# Patient Record
Sex: Female | Born: 1960 | Race: White | Hispanic: No | Marital: Married | Smoking: Never smoker
Health system: Southern US, Community
[De-identification: ages and names within clinical notes are randomized; demographics above are authoritative.]

## PROBLEM LIST (undated history)

## (undated) DIAGNOSIS — Z9889 Other specified postprocedural states: Secondary | ICD-10-CM

## (undated) DIAGNOSIS — K219 Gastro-esophageal reflux disease without esophagitis: Secondary | ICD-10-CM

## (undated) DIAGNOSIS — R112 Nausea with vomiting, unspecified: Secondary | ICD-10-CM

## (undated) HISTORY — PX: CHOLECYSTECTOMY: SHX55

## (undated) HISTORY — PX: HERNIA REPAIR: SHX51

---

## 2012-05-10 DIAGNOSIS — R7303 Prediabetes: Secondary | ICD-10-CM | POA: Insufficient documentation

## 2013-09-08 ENCOUNTER — Ambulatory Visit (INDEPENDENT_AMBULATORY_CARE_PROVIDER_SITE_OTHER): Payer: BC Managed Care – PPO | Admitting: Podiatry

## 2013-09-08 ENCOUNTER — Ambulatory Visit (INDEPENDENT_AMBULATORY_CARE_PROVIDER_SITE_OTHER): Payer: BC Managed Care – PPO

## 2013-09-08 ENCOUNTER — Encounter: Payer: Self-pay | Admitting: Podiatry

## 2013-09-08 VITALS — BP 127/72 | HR 70 | Resp 16 | Ht 63.0 in | Wt 213.0 lb

## 2013-09-08 DIAGNOSIS — M722 Plantar fascial fibromatosis: Secondary | ICD-10-CM

## 2013-09-08 MED ORDER — DICLOFENAC SODIUM 75 MG PO TBEC: 75.0000 mg | DELAYED_RELEASE_TABLET | Freq: Two times a day (BID) | ORAL | Status: DC

## 2013-09-08 MED ORDER — TRIAMCINOLONE ACETONIDE 10 MG/ML IJ SUSP
10.0000 mg | Freq: Once | INTRAMUSCULAR | Status: AC
  Administered 2013-09-08: 10 mg

## 2013-09-08 NOTE — Progress Notes (Signed)
   Subjective:    Patient ID: Rachel Kidd, female    DOB: 1961-03-31, 53 y.o.   MRN: [id number]  HPI Comments: i have heel pain in my right heel. Its been going on for 3 weeks. Its gotten worse. It hurts to walk. i wear orthotics, i wear compression hose, i used a frozen water ball, ace wrap and rest.  Foot Pain      Review of Systems  All other systems reviewed and are negative.      Objective:   Physical Exam        Assessment & Plan:

## 2013-09-08 NOTE — Progress Notes (Signed)
Subjective:     Patient ID: Rachel Kidd, female   DOB: 1960-12-19, 53 y.o.   MRN: [id number]  Foot Pain   patient presents stating my right heel has been hurting me for the last 3 weeks after I was on a ladder. I tried frozen water ball and I wear my orthotics   Review of Systems  All other systems reviewed and are negative.      Objective:   Physical Exam  Nursing note and vitals reviewed. Constitutional: She is oriented to person, place, and time.  Cardiovascular: Intact distal pulses.   Musculoskeletal: Normal range of motion.  Neurological: She is oriented to person, place, and time.  Skin: Skin is warm.   neurovascular status is intact with patient having 8/10 discomfort plantar aspect right heel at the insertion of the tendon into the calcaneus. She is noted to have normal muscle strength and range of motion of the subtalar midtarsal joint and digits are well-perfused with a depressed arch on weightbearing evaluation     Assessment:     Acute plantar fasciitis of the right heel    Plan:     H&P and x-rays reviewed with patient and at this point I have recommended injection treatment 3 mg Kenalog 5 mg Xylocaine Marcaine mixture which was administered and fascially brace. We may consider new orthotics and we'll discuss it next visit and also placed on Voltaren 75 mg twice a day

## 2013-09-22 ENCOUNTER — Ambulatory Visit (INDEPENDENT_AMBULATORY_CARE_PROVIDER_SITE_OTHER): Payer: BC Managed Care – PPO | Admitting: Podiatry

## 2013-09-22 ENCOUNTER — Ambulatory Visit: Payer: Self-pay | Admitting: Podiatry

## 2013-09-22 VITALS — BP 129/77 | HR 79 | Resp 16

## 2013-09-22 DIAGNOSIS — M722 Plantar fascial fibromatosis: Secondary | ICD-10-CM

## 2013-09-22 NOTE — Progress Notes (Signed)
Subjective:     Patient ID: Rachel Kidd, female   DOB: 1960-06-07, 53 y.o.   MRN: [id number]  HPI patient presents stating my heel is doing much better still tender when pressed but I believe it has improved. Patient states she does so well and orthotics and no she needs a new pair   Review of Systems     Objective:   Physical Exam Neurovascular status intact with significant discomfort still noted upon deep palpation but significant improvement from previous visit with plantar fasciitis right    Assessment:     Plantar fasciitis right that showing improvement    Plan:     Instructed on physical therapy and scanned for custom orthotics to reduce stress against the heel and arch reappoint when ready

## 2013-11-21 ENCOUNTER — Ambulatory Visit (INDEPENDENT_AMBULATORY_CARE_PROVIDER_SITE_OTHER): Payer: BC Managed Care – PPO | Admitting: *Deleted

## 2013-11-21 VITALS — BP 127/72 | HR 70 | Resp 16

## 2013-11-21 DIAGNOSIS — M722 Plantar fascial fibromatosis: Secondary | ICD-10-CM

## 2013-11-21 NOTE — Progress Notes (Signed)
Pt presents to pick up orthotics and went over wearing instructions. 

## 2013-11-21 NOTE — Patient Instructions (Signed)

## 2016-05-05 DIAGNOSIS — G8929 Other chronic pain: Secondary | ICD-10-CM | POA: Insufficient documentation

## 2016-05-05 DIAGNOSIS — M25562 Pain in left knee: Secondary | ICD-10-CM | POA: Insufficient documentation

## 2016-05-06 DIAGNOSIS — M170 Bilateral primary osteoarthritis of knee: Secondary | ICD-10-CM | POA: Insufficient documentation

## 2018-03-02 DIAGNOSIS — E669 Obesity, unspecified: Secondary | ICD-10-CM | POA: Insufficient documentation

## 2019-01-31 ENCOUNTER — Ambulatory Visit: Payer: Self-pay | Admitting: Podiatry

## 2019-02-07 ENCOUNTER — Ambulatory Visit (INDEPENDENT_AMBULATORY_CARE_PROVIDER_SITE_OTHER): Payer: BC Managed Care – PPO | Admitting: Podiatry

## 2019-02-07 ENCOUNTER — Other Ambulatory Visit: Payer: Self-pay

## 2019-02-07 DIAGNOSIS — M722 Plantar fascial fibromatosis: Secondary | ICD-10-CM

## 2019-02-10 NOTE — Progress Notes (Signed)
   HPI: 58 y.o. female presenting today for follow up evaluation of plantar fasciitis of the bilateral feet. She states she is doing well and denies any pain. She has not complaints or concerns and denies modifying factors. Patient is here for further evaluation and treatment.   No past medical history on file.   Physical Exam: General: The patient is alert and oriented x3 in no acute distress.  Dermatology: Skin is warm, dry and supple bilateral lower extremities. Negative for open lesions or macerations.  Vascular: Palpable pedal pulses bilaterally. No edema or erythema noted. Capillary refill within normal limits.  Neurological: Epicritic and protective threshold grossly intact bilaterally.   Musculoskeletal Exam: Range of motion within normal limits to all pedal and ankle joints bilateral. Muscle strength 5/5 in all groups bilateral.   Assessment: 1. Plantar fasciitis bilateral - resolved    Plan of Care:  1. Patient evaluated.   2. Appointment with Liliane Channel, Pedorthist, for custom molded orthotics.  3. Recommended good shoe gear.  4. Return to clinic as needed.   Works at [employer] in [place].       Edrick Kins, DPM [facility]  Dr. Edrick Kins, DPM    [address], [place] [address]                Office [phone]  Fax [phone]

## 2019-02-22 ENCOUNTER — Ambulatory Visit (INDEPENDENT_AMBULATORY_CARE_PROVIDER_SITE_OTHER): Payer: BC Managed Care – PPO | Admitting: Orthotics

## 2019-02-22 ENCOUNTER — Other Ambulatory Visit: Payer: Self-pay

## 2019-02-22 DIAGNOSIS — M722 Plantar fascial fibromatosis: Secondary | ICD-10-CM | POA: Diagnosis not present

## 2019-02-22 NOTE — Progress Notes (Signed)

## 2019-04-05 ENCOUNTER — Ambulatory Visit (INDEPENDENT_AMBULATORY_CARE_PROVIDER_SITE_OTHER): Payer: BC Managed Care – PPO | Admitting: Orthotics

## 2019-04-05 ENCOUNTER — Other Ambulatory Visit: Payer: Self-pay

## 2019-04-05 DIAGNOSIS — M722 Plantar fascial fibromatosis: Secondary | ICD-10-CM

## 2019-04-05 NOTE — Progress Notes (Signed)
Patient came in today to pick up custom made foot orthotics.  The goals were accomplished and the patient reported no dissatisfaction with said orthotics.  Patient was advised of breakin period and how to report any issues. 

## 2019-11-06 ENCOUNTER — Encounter (INDEPENDENT_AMBULATORY_CARE_PROVIDER_SITE_OTHER): Payer: Self-pay

## 2019-11-06 ENCOUNTER — Other Ambulatory Visit: Payer: Self-pay | Admitting: Physician Assistant

## 2019-11-06 ENCOUNTER — Other Ambulatory Visit: Payer: Self-pay

## 2019-11-06 ENCOUNTER — Ambulatory Visit
Admission: RE | Admit: 2019-11-06 | Discharge: 2019-11-06 | Disposition: A | Discharge status: Home / Self Care | Payer: BC Managed Care – PPO | Source: Ambulatory Visit | Attending: Physician Assistant | Admitting: Physician Assistant

## 2019-11-06 DIAGNOSIS — N950 Postmenopausal bleeding: Secondary | ICD-10-CM | POA: Diagnosis present

## 2020-04-22 ENCOUNTER — Encounter
Admission: RE | Admit: 2020-04-22 | Discharge: 2020-04-22 | Disposition: A | Discharge status: Home / Self Care | Payer: BC Managed Care – PPO | Source: Ambulatory Visit | Attending: Orthopedic Surgery | Admitting: Orthopedic Surgery

## 2020-04-22 ENCOUNTER — Other Ambulatory Visit: Payer: Self-pay

## 2020-04-22 ENCOUNTER — Encounter: Payer: Self-pay | Admitting: Urgent Care

## 2020-04-22 DIAGNOSIS — Z01812 Encounter for preprocedural laboratory examination: Secondary | ICD-10-CM | POA: Insufficient documentation

## 2020-04-22 HISTORY — DX: Nausea with vomiting, unspecified: R11.2

## 2020-04-22 HISTORY — DX: Gastro-esophageal reflux disease without esophagitis: K21.9

## 2020-04-22 HISTORY — DX: Other specified postprocedural states: Z98.890

## 2020-04-22 LAB — PROTIME-INR
INR: 0.9 (ref 0.8–1.2)
Prothrombin Time: 11.9 seconds (ref 11.4–15.2)

## 2020-04-22 LAB — URINALYSIS, COMPLETE (UACMP) WITH MICROSCOPIC
Bilirubin Urine: NEGATIVE
Glucose, UA: NEGATIVE mg/dL
Hgb urine dipstick: NEGATIVE
Ketones, ur: NEGATIVE mg/dL
Leukocytes,Ua: NEGATIVE
Nitrite: NEGATIVE
Protein, ur: NEGATIVE mg/dL
Specific Gravity, Urine: 1.005 (ref 1.005–1.030)
pH: 7 (ref 5.0–8.0)

## 2020-04-22 LAB — TYPE AND SCREEN
ABO/RH(D): O POS
Antibody Screen: NEGATIVE

## 2020-04-22 LAB — SURGICAL PCR SCREEN
MRSA, PCR: NEGATIVE
Staphylococcus aureus: NEGATIVE

## 2020-04-22 LAB — APTT: aPTT: 33 seconds (ref 24–36)

## 2020-04-22 NOTE — Patient Instructions (Signed)
Your procedure is scheduled on:  Thursday 05/02/20  Report to THE FIRST FLOOR REGISTRATION DESK IN THE MEDICAL MALL ON THE MORNING OF SURGERY FIRST, THEN YOU WILL CHECK IN AT THE SURGERY INFORMATION DESK LOCATED OUTSIDE THE SAME DAY SURGERY DEPARTMENT LOCATED ON 2ND FLOOR MEDICAL MALL ENTRANCE.  To find out your arrival time please call [phone] between 1PM - 3PM on Wednesday 05/01/20.   Remember: Instructions that are not followed completely may result in serious medical risk, up to and including death, or upon the discretion of your surgeon and anesthesiologist your surgery may need to be rescheduled.     __X__ 1. Do not eat food after midnight the night before your procedure.                 No gum chewing or hard candies. You may drink clear liquids up to 2 hours                 before you are scheduled to arrive for your surgery- DO NOT drink clear                 liquids within 2 hours of the start of your surgery.                 Clear Liquids include:  water, apple juice without pulp, clear carbohydrate                 drink such as Clearfast or Gatorade, Black Coffee or Tea (Do not add                 milk or creamer to coffee or tea).  ** Dr. Martha Clan would like for you to finish the Pre-Surgery Ensure on the morning of surgery 2 hours prior to your arrival time. **  __X__2.  On the morning of surgery brush your teeth with toothpaste and water, you may rinse your mouth with mouthwash if you wish.  Do not swallow any toothpaste or mouthwash.    __X__ 3.  No Alcohol for 24 hours before or after surgery.  __X__ 4.  Do Not Smoke or use e-cigarettes For 24 Hours Prior to Your Surgery.                 Do not use any chewable tobacco products for at least 6 hours prior to                 surgery.  __X__5.  Notify your doctor if there is any change in your medical condition      (cold, fever, infections).      Do NOT wear jewelry, make-up, hairpins, clips or nail polish. Do  NOT wear lotions, powders, or perfumes.  Do NOT shave 48 hours prior to surgery. Men may shave face and neck. Do NOT bring valuables to the hospital.     [facility] is not responsible for any belongings or valuables.   Contacts, dentures/partials or body piercings may not be worn into surgery. Bring a case for your contacts, glasses or hearing aids, a denture cup will be supplied.  Leave your suitcase in the car. After surgery it may be brought to your room.   For patients admitted to the hospital, discharge time is determined by your treatment team.    __X__ Take these medicines the morning of surgery with A SIP OF WATER:     1. NONE     __X__ Use CHG Soap as directed  __X__ Stop Blood Thinners Coumadin/Plavix/Xarelto/Pleta/Pradaxa/Eliquis/Effient/Aspirin   __X__ Stop Anti-inflammatories 7 days before surgery such as Advil, Ibuprofen, Motrin, BC or Goodies Powder, Naprosyn, Naproxen, Aleve, Aspirin, Meloxicam. May take Tylenol if needed for pain or discomfort.   __X__Do not start taking any new herbal supplements or vitamins prior to your procedure.     Wear comfortable clothing (specific to your surgery type) to the hospital.  Plan for stool softeners for home use; pain medications have a tendency to cause constipation. You can also help prevent constipation by eating foods high in fiber such as fruits and vegetables and drinking plenty of fluids as your diet allows.  After surgery, you can prevent lung complications by doing breathing exercises.Take deep breaths and cough every 1-2 hours. Your doctor may order a device called an Incentive Spirometer to help you take deep breaths.  Please call the Pre-Admissions Testing Department at [phone] if you have any questions about these instructions.

## 2020-04-23 LAB — URINE CULTURE: Culture: <10000 — AB

## 2020-04-30 ENCOUNTER — Other Ambulatory Visit: Payer: BC Managed Care – PPO

## 2020-05-02 ENCOUNTER — Inpatient Hospital Stay
Admission: RE | Admit: 2020-05-02 | Payer: BC Managed Care – PPO | Source: Home / Self Care | Admitting: Orthopedic Surgery

## 2020-05-02 ENCOUNTER — Encounter: Admission: RE | Payer: Self-pay | Source: Home / Self Care

## 2020-05-02 SURGERY — ARTHROPLASTY, KNEE, TOTAL
Anesthesia: Choice | Site: Knee | Laterality: Left

## 2020-07-19 IMAGING — US US PELVIS COMPLETE WITH TRANSVAGINAL
2 series · 13 of 25 positions shown · non-contrast
Comparison: None

CLINICAL DATA: Postmenopausal bleeding.



[Series 1: us pelvis complete with transvaginal · 0.20mm/px · 12 of 79 slices shown (1 of 2)]
[im 1/79]
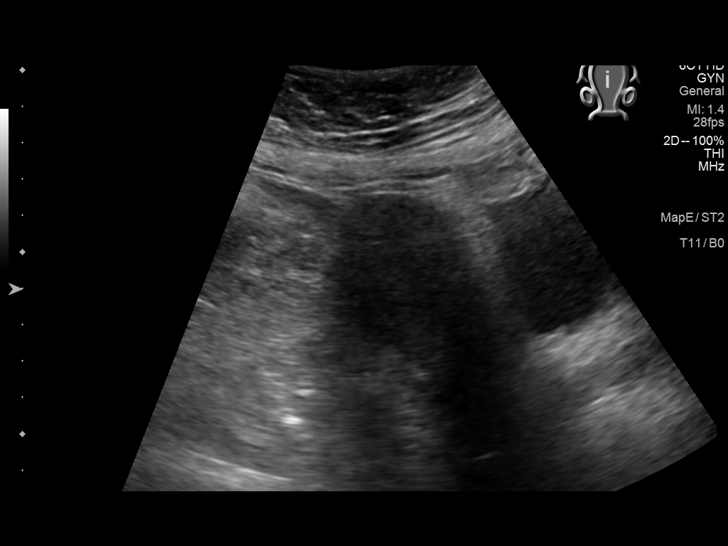
[im 7/79]
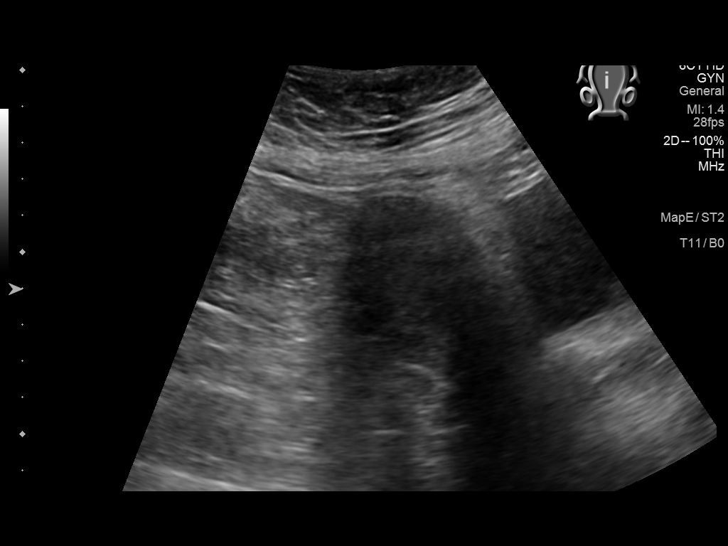
[im 14/79]
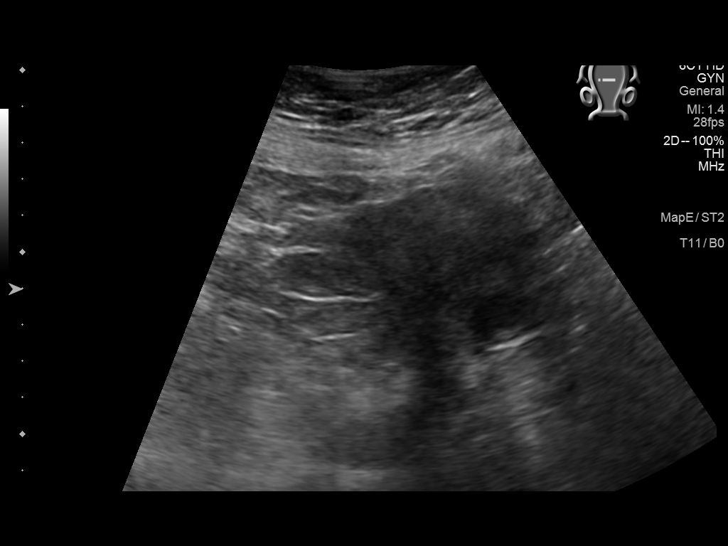
[im 21/79]
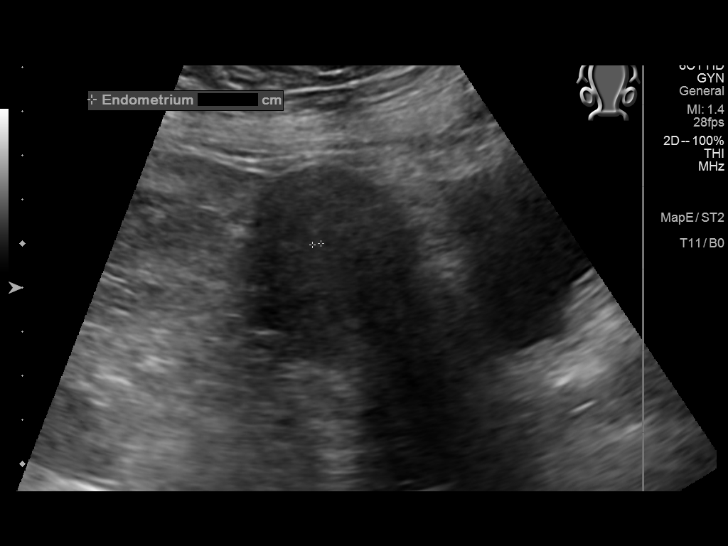
[im 28/79]
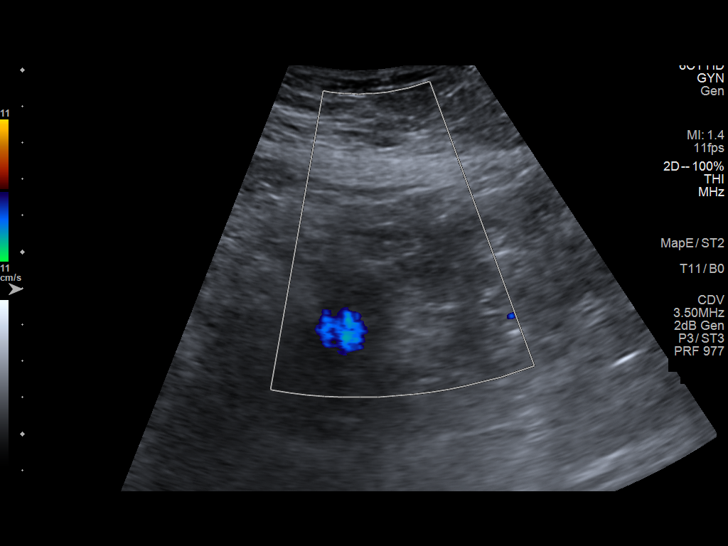
[im 34/79]
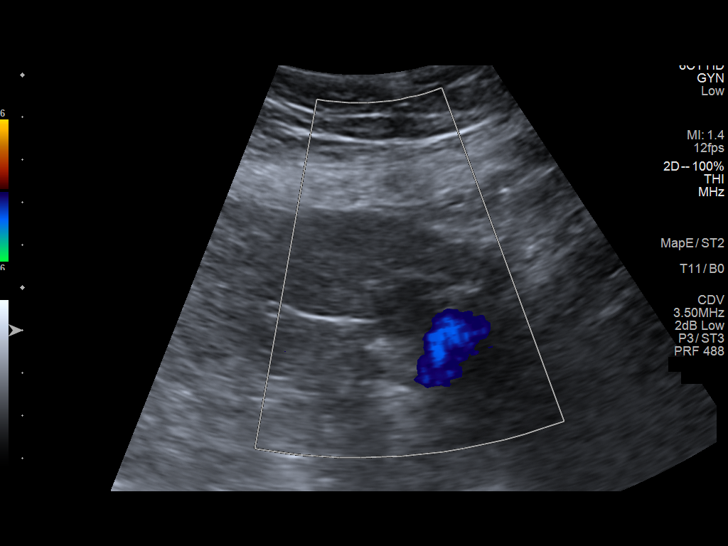
[im 41/79]
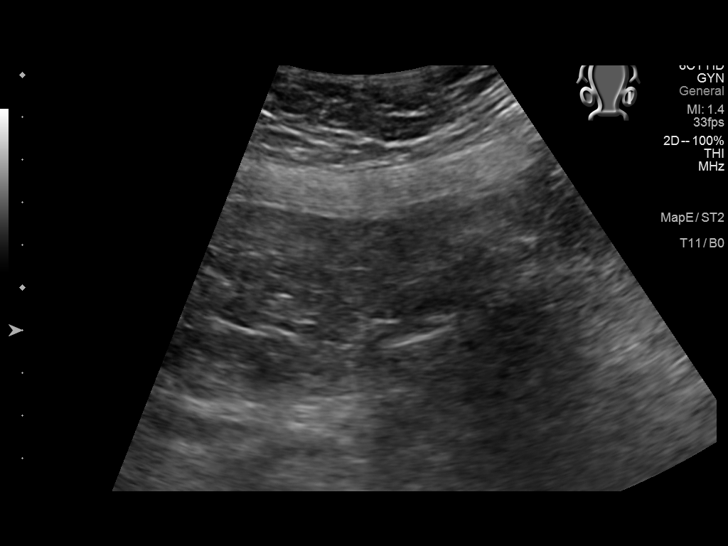
[im 48/79]
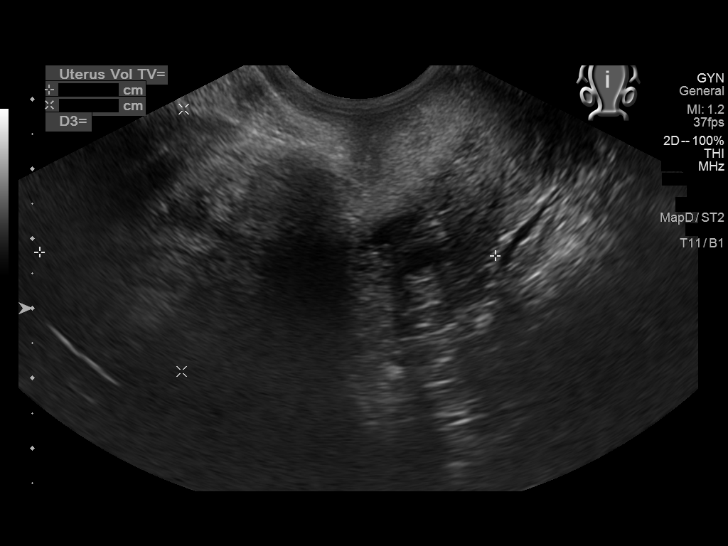
[im 55/79]
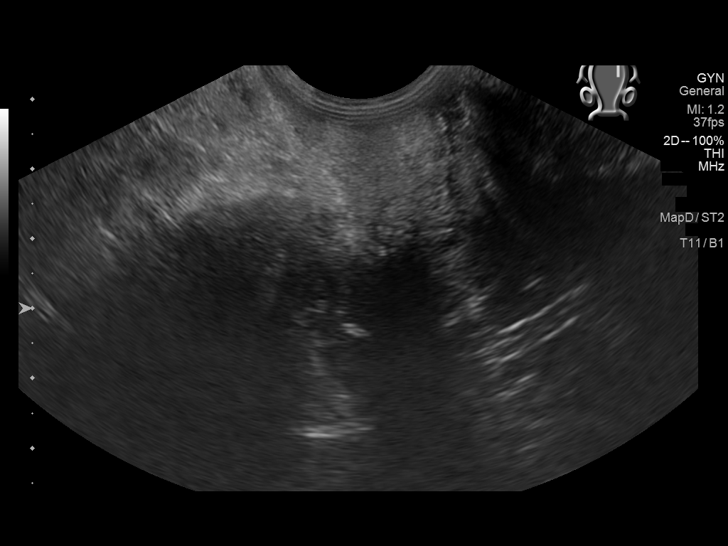
[im 62/79]
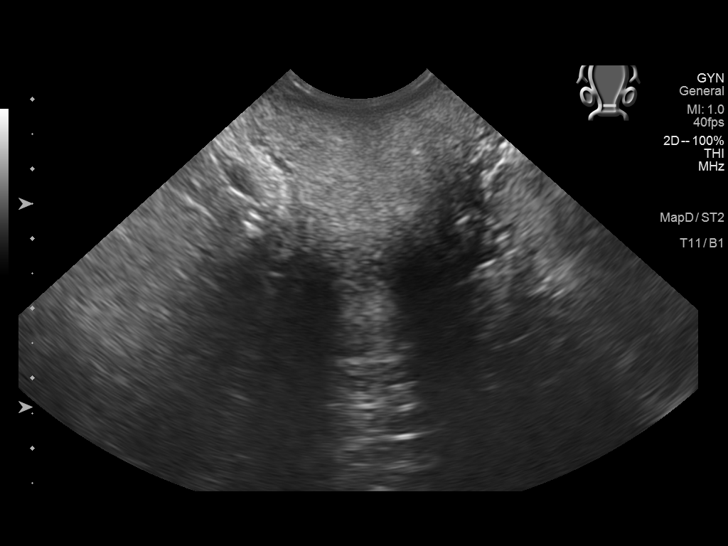
[im 68/79]
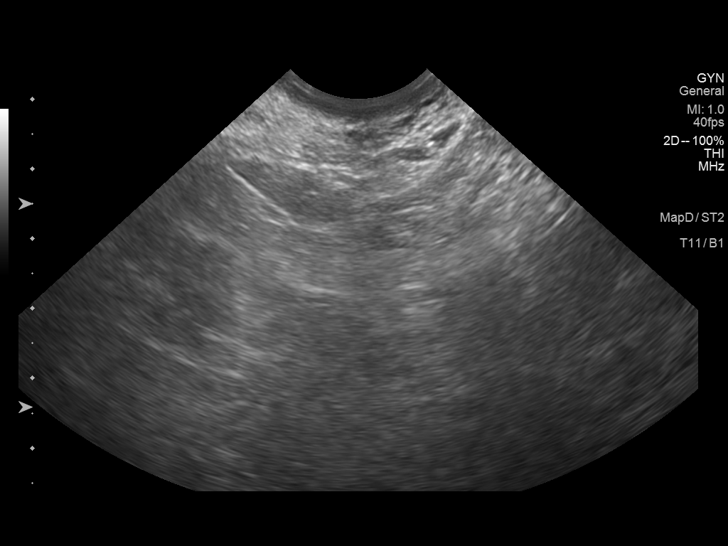
[im 75/79]
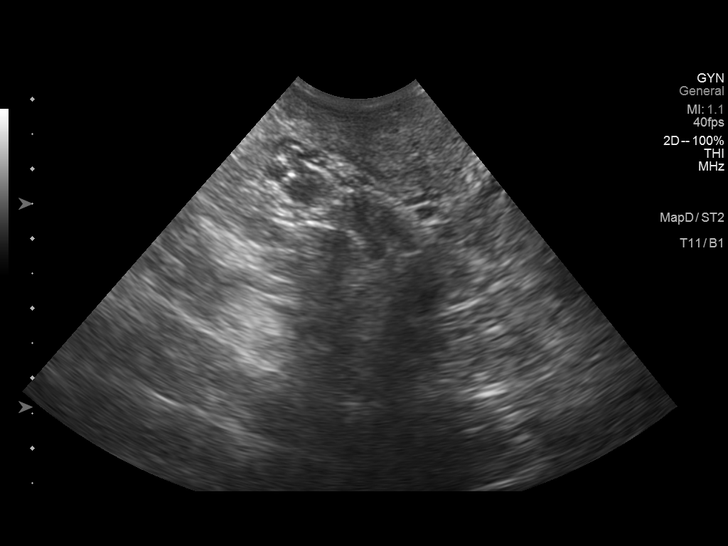

[Series 1001: us pelvis complete with transvaginal · 0.10mm/px · 1 of 1 slices shown (2 of 2)]
[im 1/1]
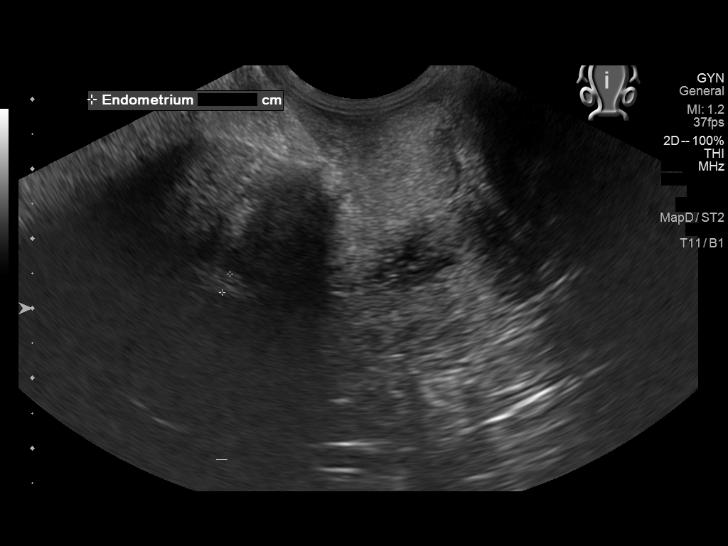

[13 of 25 positions shown; findings below may reference images not displayed]

FINDINGS: Uterus

Measurements: 8.5 x 4.9 x 3.8 cm = volume: 83 mL. 2.5 cm fibroid is
noted on the left side of the uterus.

Endometrium

Thickness: 3 mm which is within normal limits. No focal abnormality
visualized.

Right ovary

Not visualized.

Left ovary

Measurements: 1.8 x 1.4 x 1.0 cm = volume: 1 mL. Normal
appearance/no adnexal mass.

Other findings

No abnormal free fluid.
IMPRESSION: 2.5 cm uterine fibroid is noted. Endometrial thickness is within
normal limits. In the setting of post-menopausal bleeding, this is
consistent with a benign etiology such as endometrial atrophy. If
bleeding remains unresponsive to hormonal or medical therapy,
sonohysterogram should be considered for focal lesion work-up. (Ref:
Radiological Reasoning: Algorithmic Workup of Abnormal Vaginal
Bleeding with Endovaginal Sonography and Sonohysterography. AJR
[id number]; 191:S68-73).

## 2021-03-27 ENCOUNTER — Other Ambulatory Visit: Payer: Self-pay | Admitting: Urology

## 2021-03-27 DIAGNOSIS — M069 Rheumatoid arthritis, unspecified: Secondary | ICD-10-CM

## 2022-06-17 ENCOUNTER — Other Ambulatory Visit: Payer: Self-pay | Admitting: Physician Assistant

## 2022-06-17 DIAGNOSIS — Z1231 Encounter for screening mammogram for malignant neoplasm of breast: Secondary | ICD-10-CM

## 2022-06-23 ENCOUNTER — Ambulatory Visit
Admission: RE | Admit: 2022-06-23 | Discharge: 2022-06-23 | Disposition: A | Discharge status: Home / Self Care | Payer: Medicaid Other | Source: Ambulatory Visit | Attending: Physician Assistant | Admitting: Physician Assistant

## 2022-06-23 DIAGNOSIS — Z1231 Encounter for screening mammogram for malignant neoplasm of breast: Secondary | ICD-10-CM | POA: Insufficient documentation

## 2022-06-24 ENCOUNTER — Inpatient Hospital Stay
Admission: RE | Admit: 2022-06-24 | Discharge: 2022-06-24 | Disposition: A | Discharge status: Home / Self Care | Payer: Self-pay | Source: Ambulatory Visit | Attending: *Deleted | Admitting: *Deleted

## 2022-06-24 ENCOUNTER — Other Ambulatory Visit: Payer: Self-pay | Admitting: *Deleted

## 2022-06-24 DIAGNOSIS — Z1231 Encounter for screening mammogram for malignant neoplasm of breast: Secondary | ICD-10-CM

## 2023-05-17 ENCOUNTER — Other Ambulatory Visit: Payer: Self-pay | Admitting: Physician Assistant

## 2023-05-17 DIAGNOSIS — Z1231 Encounter for screening mammogram for malignant neoplasm of breast: Secondary | ICD-10-CM

## 2023-06-28 ENCOUNTER — Ambulatory Visit
Admission: RE | Admit: 2023-06-28 | Discharge: 2023-06-28 | Disposition: A | Discharge status: Home / Self Care | Payer: Medicaid Other | Source: Ambulatory Visit | Attending: Physician Assistant | Admitting: Physician Assistant

## 2023-06-28 DIAGNOSIS — Z1231 Encounter for screening mammogram for malignant neoplasm of breast: Secondary | ICD-10-CM | POA: Diagnosis present

## 2023-07-01 ENCOUNTER — Other Ambulatory Visit (HOSPITAL_COMMUNITY): Payer: Self-pay
